# Patient Record
Sex: Male | Born: 1975 | Race: Black or African American | Hispanic: No | Marital: Single | Smoking: Never smoker
Health system: Southern US, Community
[De-identification: ages and names within clinical notes are randomized; demographics above are authoritative.]

## PROBLEM LIST (undated history)

## (undated) DIAGNOSIS — K5090 Crohn's disease, unspecified, without complications: Secondary | ICD-10-CM

## (undated) HISTORY — PX: CHOLECYSTECTOMY: SHX55

## (undated) HISTORY — PX: HERNIA REPAIR: SHX51

---

## 2016-07-02 ENCOUNTER — Emergency Department (HOSPITAL_COMMUNITY): Payer: Self-pay

## 2016-07-02 ENCOUNTER — Encounter (HOSPITAL_COMMUNITY): Payer: Self-pay | Admitting: Emergency Medicine

## 2016-07-02 ENCOUNTER — Emergency Department (HOSPITAL_COMMUNITY)
Admission: EM | Admit: 2016-07-02 | Discharge: 2016-07-02 | Disposition: A | Discharge status: Home / Self Care | Payer: Self-pay | Attending: Emergency Medicine | Admitting: Emergency Medicine

## 2016-07-02 DIAGNOSIS — R1084 Generalized abdominal pain: Secondary | ICD-10-CM

## 2016-07-02 DIAGNOSIS — R1110 Vomiting, unspecified: Secondary | ICD-10-CM | POA: Insufficient documentation

## 2016-07-02 DIAGNOSIS — R197 Diarrhea, unspecified: Secondary | ICD-10-CM | POA: Insufficient documentation

## 2016-07-02 DIAGNOSIS — R369 Urethral discharge, unspecified: Secondary | ICD-10-CM | POA: Insufficient documentation

## 2016-07-02 HISTORY — DX: Crohn's disease, unspecified, without complications: K50.90

## 2016-07-02 LAB — URINALYSIS, ROUTINE W REFLEX MICROSCOPIC
BILIRUBIN URINE: NEGATIVE
Glucose, UA: NEGATIVE mg/dL
HGB URINE DIPSTICK: NEGATIVE
KETONES UR: NEGATIVE mg/dL
Nitrite: NEGATIVE
PROTEIN: NEGATIVE mg/dL
Specific Gravity, Urine: 1.016 (ref 1.005–1.030)
pH: 5.5 (ref 5.0–8.0)

## 2016-07-02 LAB — COMPREHENSIVE METABOLIC PANEL
ALK PHOS: 61 U/L (ref 38–126)
ALT: 20 U/L (ref 17–63)
ANION GAP: 8 (ref 5–15)
AST: 25 U/L (ref 15–41)
Albumin: 3.9 g/dL (ref 3.5–5.0)
BUN: 8 mg/dL (ref 6–20)
CALCIUM: 8.5 mg/dL — AB (ref 8.9–10.3)
CHLORIDE: 107 mmol/L (ref 101–111)
CO2: 24 mmol/L (ref 22–32)
Creatinine, Ser: 0.65 mg/dL (ref 0.61–1.24)
GFR calc non Af Amer: >60 mL/min (ref >60)
Glucose, Bld: 76 mg/dL (ref 65–99)
Potassium: 2.9 mmol/L — ABNORMAL LOW (ref 3.5–5.1)
SODIUM: 139 mmol/L (ref 135–145)
Total Bilirubin: 0.4 mg/dL (ref 0.3–1.2)
Total Protein: 7.4 g/dL (ref 6.5–8.1)

## 2016-07-02 LAB — ABO/RH: ABO/RH(D): O POS

## 2016-07-02 LAB — CBC
HCT: 38.1 % — ABNORMAL LOW (ref 39.0–52.0)
HEMOGLOBIN: 13 g/dL (ref 13.0–17.0)
MCH: 29.1 pg (ref 26.0–34.0)
MCHC: 34.1 g/dL (ref 30.0–36.0)
MCV: 85.4 fL (ref 78.0–100.0)
Platelets: 284 10*3/uL (ref 150–400)
RBC: 4.46 MIL/uL (ref 4.22–5.81)
RDW: 14 % (ref 11.5–15.5)
WBC: 8.5 10*3/uL (ref 4.0–10.5)

## 2016-07-02 LAB — URINE MICROSCOPIC-ADD ON

## 2016-07-02 LAB — TYPE AND SCREEN
ABO/RH(D): O POS
Antibody Screen: NEGATIVE

## 2016-07-02 MED ORDER — HYDROMORPHONE HCL 1 MG/ML IJ SOLN
1.0000 mg | Freq: Once | INTRAMUSCULAR | Status: AC
Start: 2016-07-02 — End: 2016-07-02
  Administered 2016-07-02: 1 mg via INTRAVENOUS
  Filled 2016-07-02: qty 1

## 2016-07-02 MED ORDER — POTASSIUM CHLORIDE CRYS ER 20 MEQ PO TBCR
40.0000 meq | EXTENDED_RELEASE_TABLET | Freq: Once | ORAL | Status: AC
  Administered 2016-07-02: 40 meq via ORAL
  Filled 2016-07-02: qty 2

## 2016-07-02 MED ORDER — ONDANSETRON HCL 4 MG PO TABS: 4.0000 mg | ORAL_TABLET | Freq: Three times a day (TID) | ORAL | 0 refills | Status: AC | PRN

## 2016-07-02 MED ORDER — FAMOTIDINE IN NACL 20-0.9 MG/50ML-% IV SOLN
20.0000 mg | Freq: Two times a day (BID) | INTRAVENOUS | Status: DC
  Administered 2016-07-02: 20 mg via INTRAVENOUS
  Filled 2016-07-02: qty 50

## 2016-07-02 MED ORDER — HYDROCODONE-ACETAMINOPHEN 5-325 MG PO TABS: 1.0000 | ORAL_TABLET | ORAL | 0 refills | Status: AC | PRN

## 2016-07-02 MED ORDER — POTASSIUM CHLORIDE 10 MEQ/100ML IV SOLN
10.0000 meq | Freq: Once | INTRAVENOUS | Status: AC
  Administered 2016-07-02: 10 meq via INTRAVENOUS
  Filled 2016-07-02: qty 100

## 2016-07-02 MED ORDER — OXYCODONE-ACETAMINOPHEN 5-325 MG PO TABS
1.0000 | ORAL_TABLET | Freq: Once | ORAL | Status: AC
Start: 2016-07-02 — End: 2016-07-02
  Administered 2016-07-02: 1 via ORAL
  Filled 2016-07-02: qty 1

## 2016-07-02 MED ORDER — FAMOTIDINE 20 MG PO TABS: 20.0000 mg | ORAL_TABLET | Freq: Two times a day (BID) | ORAL | 0 refills | Status: AC

## 2016-07-02 MED ORDER — DICYCLOMINE HCL 20 MG PO TABS: 20.0000 mg | ORAL_TABLET | Freq: Three times a day (TID) | ORAL | 0 refills | Status: AC | PRN

## 2016-07-02 MED ORDER — HYDROMORPHONE HCL 1 MG/ML IJ SOLN
1.0000 mg | Freq: Once | INTRAMUSCULAR | Status: AC
  Administered 2016-07-02: 1 mg via INTRAVENOUS
  Filled 2016-07-02: qty 1

## 2016-07-02 MED ORDER — METOCLOPRAMIDE HCL 5 MG/ML IJ SOLN
10.0000 mg | Freq: Once | INTRAMUSCULAR | Status: AC
  Administered 2016-07-02: 10 mg via INTRAVENOUS
  Filled 2016-07-02: qty 2

## 2016-07-02 MED ORDER — SODIUM CHLORIDE 0.9 % IV BOLUS (SEPSIS)
1000.0000 mL | Freq: Once | INTRAVENOUS | Status: AC
  Administered 2016-07-02: 1000 mL via INTRAVENOUS

## 2016-07-02 MED ORDER — ONDANSETRON HCL 4 MG/2ML IJ SOLN
4.0000 mg | Freq: Once | INTRAMUSCULAR | Status: AC
  Administered 2016-07-02: 4 mg via INTRAVENOUS
  Filled 2016-07-02: qty 2

## 2016-07-02 MED FILL — HYDROCODON-APAP 5-325: 5-325 | 1 days supply | Qty: 10 | Fill #0

## 2016-07-02 NOTE — ED Notes (Signed)
PA at bedside.

## 2016-07-02 NOTE — ED Notes (Signed)
Patient transported to CT 

## 2016-07-02 NOTE — ED Notes (Signed)
Patient c/o generalized abdominal pain, dark tarry stool.   Last VS: 168/98, 110hr, cbg 87

## 2016-07-02 NOTE — ED Notes (Signed)
Went to draw labs HIV RPR. Patient wants 2nd IV. RN aware.

## 2016-07-02 NOTE — ED Notes (Signed)
Patient requesting to speak with doctor.   Mart PiggsMade Emily PA aware.

## 2016-07-02 NOTE — ED Notes (Signed)
Attempted to get hemoccult, patient resisting. Waiting on MD to do exam.

## 2016-07-02 NOTE — Discharge Instructions (Signed)
Read the information below.  Use the prescribed medication as directed.  Please discuss all new medications with your pharmacist.  You may return to the Emergency Department at any time for worsening condition or any new symptoms that concern you.   If you develop high fevers, worsening abdominal pain, uncontrolled vomiting, or are unable to tolerate fluids by mouth, return to the ER for a recheck.  ° °

## 2016-07-02 NOTE — ED Notes (Signed)
Patient requesting to speak with MD "not the PA".  Made

## 2016-07-02 NOTE — ED Provider Notes (Signed)
6:56 AM Patient signed out to me at change of shift by Elpidio AnisShari Upstill, PA-C.  Pt from out of town, states he is having a Crohn's flare.  Plan is for CT abd/pelvis.    9:20 AM CT results pending.  Abdomen is diffusely tender.  Large vertical surgical scar. Superficial ulceration overlying scar.  No surrounding erythema, discharge.   Reports two days of pain, profuse diarrhea, bloody diarrhea, bloody vomit.  Twice as much diarrhea (9 episodes/day) vs vomit (4-5 episodes/day).  Also notes urinating in spurts, white penile discharge.   Hx: Colostomy, colostomy reversal, abdominal mesh for hernia repair, cholecystectomy  CT demonstrates chronic changes.   Patient has not been seen vomiting or having diarrhea since arrival, per nurse.  UA and symptom of penile discharge concerning for STD.    Pt has informed multiple staff members that he would like to see an MD and does not want to see a PA anymore.  I did not speak with patient regarding UA results.  Dr Patria Maneampos will speak with patient prior to discharge.   Results for orders placed or performed during the hospital encounter of 07/02/16  Comprehensive metabolic panel  Result Value Ref Range   Sodium 139 135 - 145 mmol/L   Potassium 2.9 (L) 3.5 - 5.1 mmol/L   Chloride 107 101 - 111 mmol/L   CO2 24 22 - 32 mmol/L   Glucose, Bld 76 65 - 99 mg/dL   BUN 8 6 - 20 mg/dL   Creatinine, Ser 9.600.65 0.61 - 1.24 mg/dL   Calcium 8.5 (L) 8.9 - 10.3 mg/dL   Total Protein 7.4 6.5 - 8.1 g/dL   Albumin 3.9 3.5 - 5.0 g/dL   AST 25 15 - 41 U/L   ALT 20 17 - 63 U/L   Alkaline Phosphatase 61 38 - 126 U/L   Total Bilirubin 0.4 0.3 - 1.2 mg/dL   GFR calc non Af Amer >60 >60 mL/min   GFR calc Af Amer >60 >60 mL/min   Anion gap 8 5 - 15  CBC  Result Value Ref Range   WBC 8.5 4.0 - 10.5 K/uL   RBC 4.46 4.22 - 5.81 MIL/uL   Hemoglobin 13.0 13.0 - 17.0 g/dL   HCT 45.438.1 (L) 09.839.0 - 11.952.0 %   MCV 85.4 78.0 - 100.0 fL   MCH 29.1 26.0 - 34.0 pg   MCHC 34.1 30.0 - 36.0 g/dL    RDW 14.714.0 82.911.5 - 56.215.5 %   Platelets 284 150 - 400 K/uL  Urinalysis, Routine w reflex microscopic  Result Value Ref Range   Color, Urine YELLOW YELLOW   APPearance CLEAR CLEAR   Specific Gravity, Urine 1.016 1.005 - 1.030   pH 5.5 5.0 - 8.0   Glucose, UA NEGATIVE NEGATIVE mg/dL   Hgb urine dipstick NEGATIVE NEGATIVE   Bilirubin Urine NEGATIVE NEGATIVE   Ketones, ur NEGATIVE NEGATIVE mg/dL   Protein, ur NEGATIVE NEGATIVE mg/dL   Nitrite NEGATIVE NEGATIVE   Leukocytes, UA MODERATE (A) NEGATIVE  Urine microscopic-add on  Result Value Ref Range   Squamous Epithelial / LPF 0-5 (A) NONE SEEN   WBC, UA 6-30 0 - 5 WBC/hpf   RBC / HPF 0-5 0 - 5 RBC/hpf   Bacteria, UA FEW (A) NONE SEEN  [facility] and screen [facility]  Result Value Ref Range   ABO/RH(D) O POS    Antibody Screen NEG    Sample Expiration 07/05/2016   ABO/Rh  Result Value Ref Range  ABO/RH(D) O POS    Ct Abdomen Pelvis Wo Contrast  Result Date: 07/02/2016 CLINICAL DATA:  40 year old male with Crohn disease and several days of abdominal pain. Initial encounter. EXAM: CT ABDOMEN AND PELVIS WITHOUT CONTRAST TECHNIQUE: Multidetector CT imaging of the abdomen and pelvis was performed following the standard protocol without IV contrast. COMPARISON:  None. FINDINGS: Negative lung bases. No acute osseous abnormality identified. Oral contrast was administered. No IV contrast. Oral contrast has reached the rectum. No pelvic free fluid. Unremarkable urinary bladder. There is a sigmoid colon anastomosis (coronal image 68). The sigmoid and directly upstream small bowel loop are patulous and distended with gas and contrast. Between the sigmoid colon and rectum there is an area of concentric narrowing (series 2, image 67) which presumably represents physiologic peristalsis of the residual large bowel. The remaining large intestine is surgically absent. The duodenum does not cross midline and proximal small bowel loops are  located in the right abdomen. Some of the more distal small bowel including that which anastomosis with the sigmoid are mildly enlarged, but otherwise normal with no definite areas of bowel wall thickening, and no mesenteric stranding. No abdominal free air or free fluid. Negative noncontrast liver, spleen, pancreas and adrenal glands. The gallbladder is contracted. The noncontrast kidneys are nonobstructed. There is suggestion of a left renal midpole low-density cyst (series 2, image 35). There is an indeterminate 3 cm rounded soft tissue contour at the low right renal midpole best seen on series 2, image 25. No right perinephric stranding. Negative course of both ureters. No lymphadenopathy. Postoperative changes to the ventral abdominal wall with no adverse features. IMPRESSION: 1. Oral contrast administered and has reached the rectum without obstruction. Status post subtotal colectomy with a small bowel to sigmoid colon anastomosis which is patulous but otherwise normal. 2. There is a 15 mm segment of concentric narrowing at the junction of the sigmoid colon and rectum (series 2, image 67) which presumably represents physiologic large bowel peristalsis, but a stricture or stenosis here cannot be excluded. 3. Superimposed congenital midgut malrotation, a the duodenum does not cross midline and proximal small bowel is located in the right abdomen. 4. Indeterminate 3 cm rounded contour at the right renal midpole (series 2, image 25). Follow-up Renal Ultrasound may be the simplest way to exclude a solid right renal mass. 5. No evidence of active Crohn disease on this non-IV-contrast study. Electronically Signed   By: Odessa FlemingH  Hall M.D.   On: 07/02/2016 09:22      Trixie Dredgemily Brennah Quraishi, PA-C 07/02/16 1319

## 2016-07-02 NOTE — ED Triage Notes (Signed)
Patient reports history of Crohn's, c/o generalized abdominal pain, 8 episodes of emesis, 8 episodes of watery diarrhea.

## 2016-07-02 NOTE — ED Provider Notes (Signed)
[facility]-EMERGENCY DEPT Provider Note   CSN: [id number] Arrival date &[id number] time: 07/02/16  0039     History   Chief Complaint Chief Complaint  Patient presents with  . Abdominal Pain    HPI Lee Holmes is a 40 y.o. male.  [place] stated history of Crohn's colitis presents with progressively worsening lower abdominal pain typical of crohn's flare for the past 2 days. No fever. He reports multiple episodes of watery, dark stool with BRB blood present. No nausea or vomiting. No urinary symptoms.    The history is provided by the patient. No language interpreter was used.  Abdominal Pain   Associated symptoms include diarrhea. Pertinent negatives include fever and myalgias.    Past Medical History:  Diagnosis Date  . Crohn's disease (HCC)     There are no active problems to display for this patient.   Past Surgical History:  Procedure Laterality Date  . CHOLECYSTECTOMY    . HERNIA REPAIR         Home Medications    Prior to Admission medications   Not on File    Family History No family history on file.  Social History Social History  Substance Use Topics  . Smoking status: Never Smoker  . Smokeless tobacco: Never Used  . Alcohol use No     Allergies   Contrast media [iodinated diagnostic agents]; Morphine and related; and Shellfish allergy   Review of Systems Review of Systems  Constitutional: Negative for fever.  Respiratory: Negative.   Cardiovascular: Negative.   Gastrointestinal: Positive for abdominal pain, blood in stool and diarrhea.  Genitourinary: Negative.   Musculoskeletal: Negative for myalgias.  Skin: Negative for color change.  Neurological: Negative.  Negative for syncope and light-headedness.  All other systems reviewed and are negative.    Physical Exam Updated Vital Signs BP 151/97 (BP Location: Right Arm)   Pulse 89   Temp 98 F (36.7 C) (Oral)   Resp 23   Ht 5\' 5"  (1.651 m)   Wt 68 kg   SpO2 100%    BMI 24.96 kg/m   Physical Exam  Constitutional: He appears well-developed and well-nourished.  Uncomfortable appearing.  HENT:  Head: Normocephalic.  Neck: Normal range of motion. Neck supple.  Cardiovascular: Normal rate and regular rhythm.   Pulmonary/Chest: Effort normal and breath sounds normal.  Abdominal: Bowel sounds are normal. There is tenderness (Tender across lower abdomen). There is guarding. There is no rebound.  Musculoskeletal: Normal range of motion.  Neurological: He is alert. No cranial nerve deficit.  Skin: Skin is warm and dry. No rash noted.  Psychiatric: He has a normal mood and affect.     ED Treatments / Results  Labs (all labs ordered are listed, but only abnormal results are displayed) Labs Reviewed  COMPREHENSIVE METABOLIC PANEL - Abnormal; Notable for the following:       Result Value   Potassium 2.9 (*)    Calcium 8.5 (*)    All other components within normal limits  CBC - Abnormal; Notable for the following:    HCT 38.1 (*)    All other components within normal limits  TYPE AND SCREEN  ABO/RH   Results for orders placed or performed during the hospital encounter of 07/02/16  Comprehensive metabolic panel  Result Value Ref Range   Sodium 139 135 - 145 mmol/L   Potassium 2.9 (L) 3.5 - 5.1 mmol/L   Chloride 107 101 - 111 mmol/L   CO2  24 22 - 32 mmol/L   Glucose, Bld 76 65 - 99 mg/dL   BUN 8 6 - 20 mg/dL   Creatinine, Ser 0.450.65 0.61 - 1.24 mg/dL   Calcium 8.5 (L) 8.9 - 10.3 mg/dL   Total Protein 7.4 6.5 - 8.1 g/dL   Albumin 3.9 3.5 - 5.0 g/dL   AST 25 15 - 41 U/L   ALT 20 17 - 63 U/L   Alkaline Phosphatase 61 38 - 126 U/L   Total Bilirubin 0.4 0.3 - 1.2 mg/dL   GFR calc non Af Amer >60 >60 mL/min   GFR calc Af Amer >60 >60 mL/min   Anion gap 8 5 - 15  CBC  Result Value Ref Range   WBC 8.5 4.0 - 10.5 K/uL   RBC 4.46 4.22 - 5.81 MIL/uL   Hemoglobin 13.0 13.0 - 17.0 g/dL   HCT 40.938.1 (L) 81.139.0 - 91.452.0 %   MCV 85.4 78.0 - 100.0 fL   MCH  29.1 26.0 - 34.0 pg   MCHC 34.1 30.0 - 36.0 g/dL   RDW 78.214.0 95.611.5 - 21.315.5 %   Platelets 284 150 - 400 K/uL  Type and screen [facility]  Result Value Ref Range   ABO/RH(D) O POS    Antibody Screen NEG    Sample Expiration 07/05/2016     EKG  EKG Interpretation None       Radiology No results found.  Procedures Procedures (including critical care time)  Medications Ordered in ED Medications  sodium chloride 0.9 % bolus 1,000 mL (0 mLs Intravenous Stopped 07/02/16 0638)  HYDROmorphone (DILAUDID) injection 1 mg (1 mg Intravenous Given 07/02/16 0515)  ondansetron (ZOFRAN) injection 4 mg (4 mg Intravenous Given 07/02/16 0516)  HYDROmorphone (DILAUDID) injection 1 mg (1 mg Intravenous Given 07/02/16 0638)  metoCLOPramide (REGLAN) injection 10 mg (10 mg Intravenous Given 07/02/16 [id number])     Initial Impression / Assessment and Plan / ED Course  I have reviewed the triage vital signs and the nursing notes.  Pertinent labs & imaging results that were available during my care of the patient were reviewed by me and considered in my medical decision making (see chart for details).  Clinical Course    Patient presents with stated Crohn's flare up x 2 days. No fever. CT scan pending secondary to significant abdominal tenderness and guarding. Intolerant of digital rectal exam but shows a picture on his I-phone of bloody toilet water stating this was his last bowel movement.   No previous records to review. Patient provided 2 mg Dilaudid with some relief. Will observe.   [facility] care turned over to [facility], PA-C, pending CT scan.   Final Clinical Impressions(s) / ED Diagnoses   Final diagnoses:  None   1. Abdominal pain  New Prescriptions New Prescriptions   No medications on file     Elpidio AnisShari Sherwood Castilla, Cordelia Poche-C 07/02/16 [id number]    Paula LibraJohn Molpus, MD 07/02/16 [phone]

## 2016-07-03 LAB — GC/CHLAMYDIA PROBE AMP (~~LOC~~) NOT AT ARMC
CHLAMYDIA, DNA PROBE: NEGATIVE
Neisseria Gonorrhea: NEGATIVE

## 2016-07-03 LAB — HIV ANTIBODY (ROUTINE TESTING W REFLEX): HIV SCREEN 4TH GENERATION: NONREACTIVE

## 2016-07-03 LAB — RPR: RPR: NONREACTIVE

## 2017-05-02 IMAGING — CT CT ABD-PELV W/O CM
2 of 4 series · 15 of 46 positions shown, 17 images · non-contrast
Comparison: None.

CLINICAL DATA: 40-year-old male with Crohn disease and several days
of abdominal pain. Initial encounter.

EXAM:
CT ABDOMEN AND PELVIS WITHOUT CONTRAST
TECHNIQUE: Multidetector CT imaging of the abdomen and pelvis was performed
following the standard protocol without IV contrast.

[Series 2: abd/pel w/o · axial · non-contrast · 0.68mm/px · z∈[-416,-26]mm · 12 of 88 slices shown, 14 images]
[im 5/88  soft-tissue]
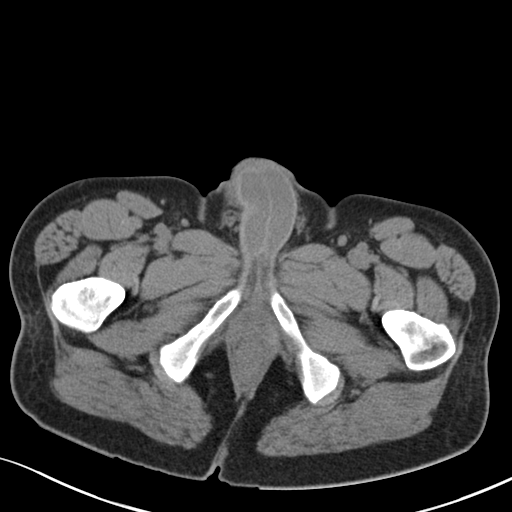
[im 5/88  bone]
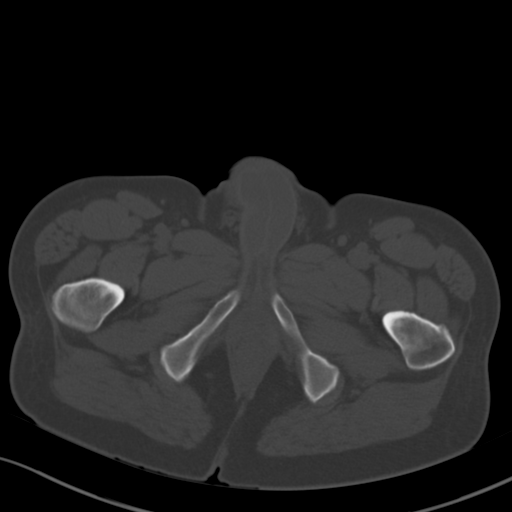
[im 15/88  soft-tissue]
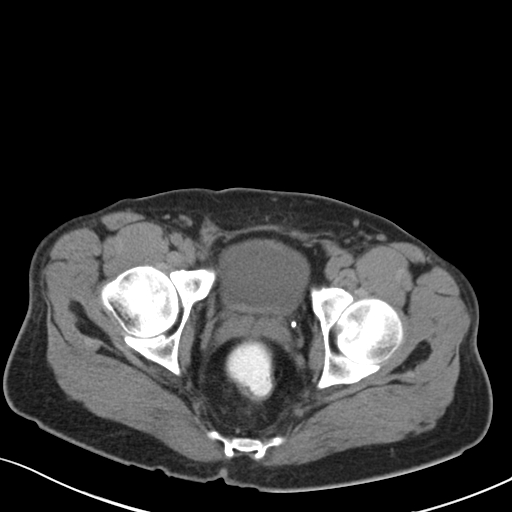
[im 20/88  soft-tissue]
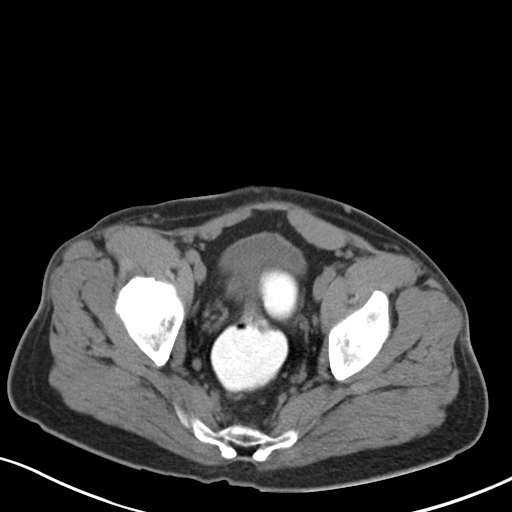
[im 25/88  soft-tissue]
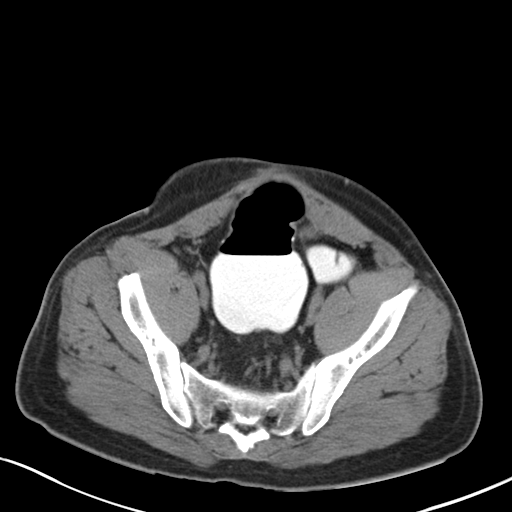
[im 34/88  soft-tissue]
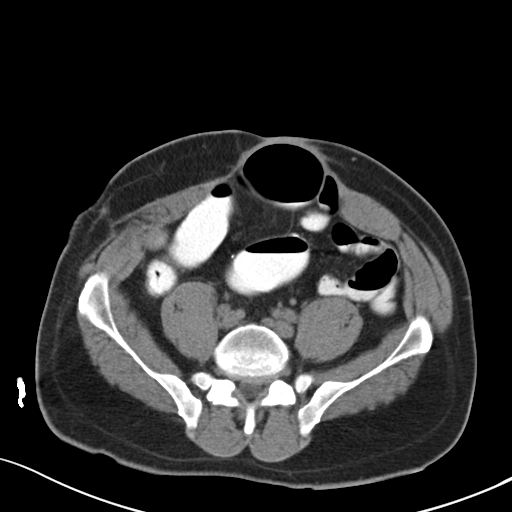
[im 39/88  soft-tissue]
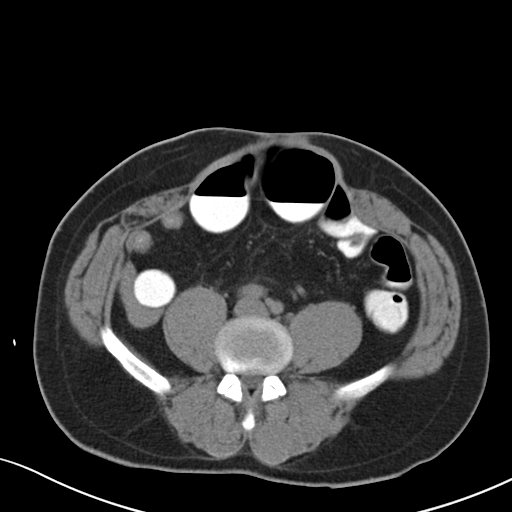
[im 49/88  soft-tissue]
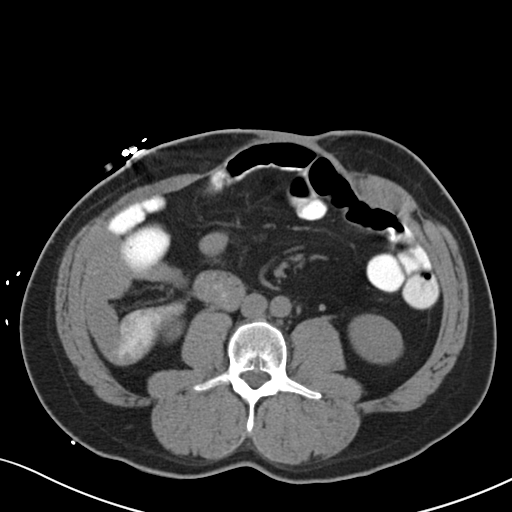
[im 54/88  soft-tissue]
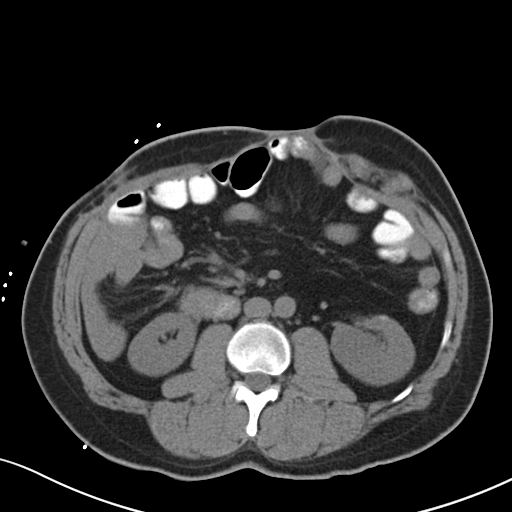
[im 63/88  soft-tissue]
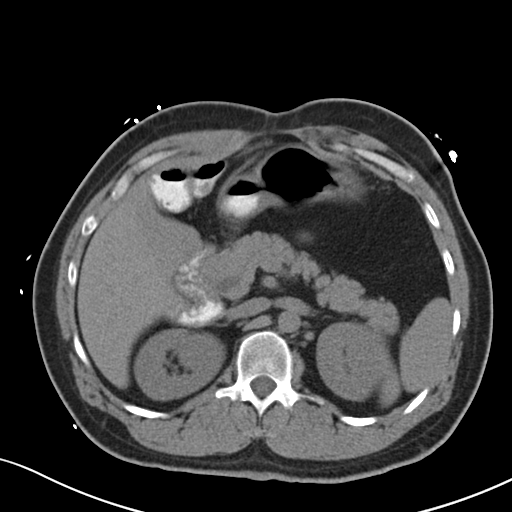
[im 63/88  bone]
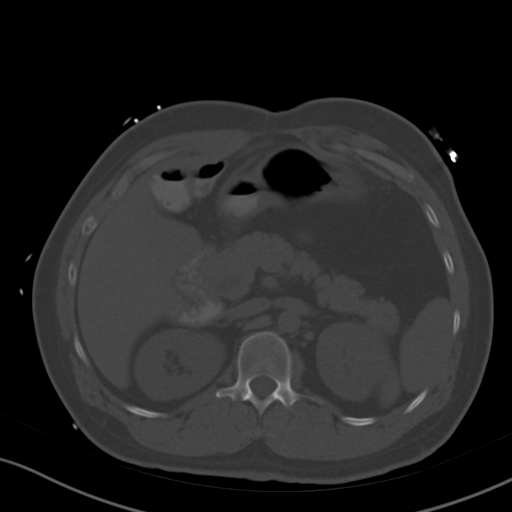
[im 68/88  soft-tissue]
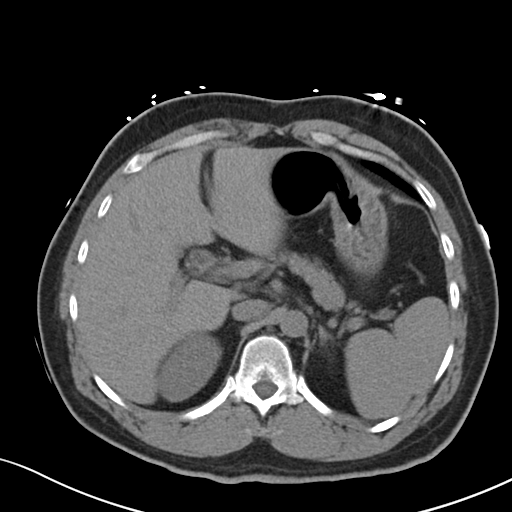
[im 73/88  soft-tissue]
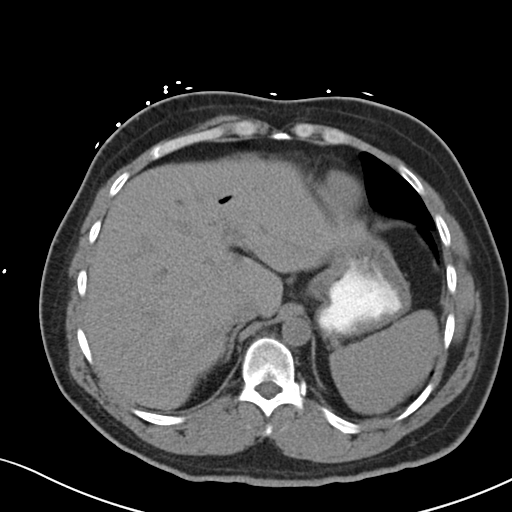
[im 83/88  soft-tissue]
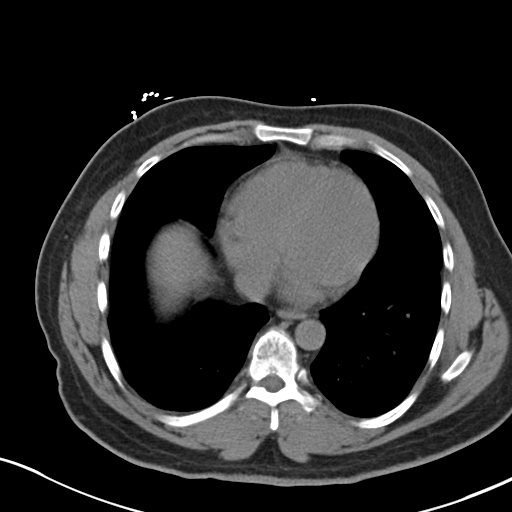

[Series 6: coronal · coronal · 0.71mm/px · 3 of 150 slices shown]
[im 50/150  soft-tissue]
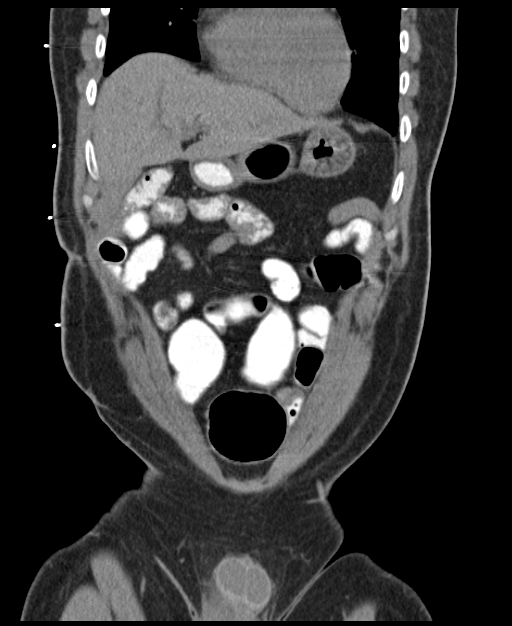
[im 67/150  soft-tissue]
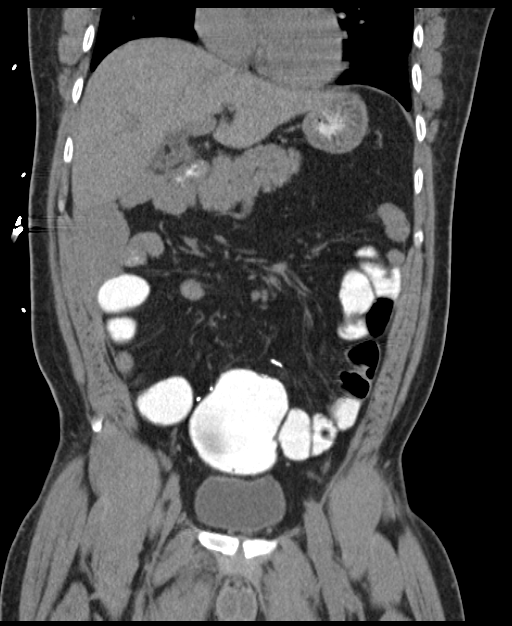
[im 83/150  soft-tissue]
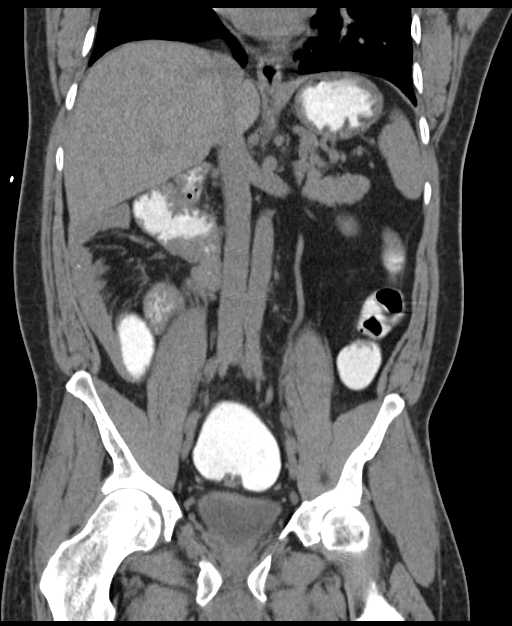

[15 of 46 positions shown; findings below may reference images not displayed]

FINDINGS: Negative lung bases.

No acute osseous abnormality identified.

Oral contrast was administered. No IV contrast. Oral contrast has
reached the rectum. No pelvic free fluid. Unremarkable urinary
bladder.

There is a sigmoid colon anastomosis (coronal image 68). The sigmoid
and directly upstream small bowel loop are patulous and distended
with gas and contrast. Between the sigmoid colon and rectum there is
an area of concentric narrowing (series 2, image 67) which
presumably represents physiologic peristalsis of the residual large
bowel.

The remaining large intestine is surgically absent.

The duodenum does not cross midline and proximal small bowel loops
are located in the right abdomen. Some of the more distal small
bowel including that which anastomosis with the sigmoid are mildly
enlarged, but otherwise normal with no definite areas of bowel wall
thickening, and no mesenteric stranding.

No abdominal free air or free fluid. Negative noncontrast liver,
spleen, pancreas and adrenal glands. The gallbladder is contracted.

The noncontrast kidneys are nonobstructed. There is suggestion of a
left renal midpole low-density cyst (series 2, image 35). There is
an indeterminate 3 cm rounded soft tissue contour at the low right
renal midpole best seen on series 2, image 25. No right perinephric
stranding. Negative course of both ureters. No lymphadenopathy.

Postoperative changes to the ventral abdominal wall with no adverse
features.
IMPRESSION: 1. Oral contrast administered and has reached the rectum without
obstruction. Status post subtotal colectomy with a small bowel to
sigmoid colon anastomosis which is patulous but otherwise normal.
2. There is a 15 mm segment of concentric narrowing at the junction
of the sigmoid colon and rectum (series 2, image 67) which
presumably represents physiologic large bowel peristalsis, but a
stricture or stenosis here cannot be excluded.
3. Superimposed congenital midgut malrotation, a the duodenum does
not cross midline and proximal small bowel is located in the right
abdomen.
4. Indeterminate 3 cm rounded contour at the right renal midpole
(series 2, image 25). Follow-up Renal Ultrasound may be the simplest
way to exclude a solid right renal mass.
5. No evidence of active Crohn disease on this non-IV-contrast
study.
